# Patient Record
Sex: Male | Born: 1995 | Race: Black or African American | Hispanic: No | Marital: Single | Smoking: Current every day smoker
Health system: Southern US, Community
[De-identification: ages and names within clinical notes are randomized; demographics above are authoritative.]

## PROBLEM LIST (undated history)

## (undated) DIAGNOSIS — J45909 Unspecified asthma, uncomplicated: Secondary | ICD-10-CM

---
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
## 2015-07-31 ENCOUNTER — Emergency Department (HOSPITAL_COMMUNITY)
Admission: EM | Admit: 2015-07-31 | Discharge: 2015-07-31 | Disposition: A | Discharge status: Home / Self Care | Payer: Medicaid Other | Attending: Emergency Medicine | Admitting: Emergency Medicine

## 2015-07-31 ENCOUNTER — Encounter (HOSPITAL_COMMUNITY): Payer: Self-pay | Admitting: Emergency Medicine

## 2015-07-31 DIAGNOSIS — Y998 Other external cause status: Secondary | ICD-10-CM | POA: Diagnosis not present

## 2015-07-31 DIAGNOSIS — F17200 Nicotine dependence, unspecified, uncomplicated: Secondary | ICD-10-CM | POA: Diagnosis not present

## 2015-07-31 DIAGNOSIS — W268XXA Contact with other sharp object(s), not elsewhere classified, initial encounter: Secondary | ICD-10-CM | POA: Insufficient documentation

## 2015-07-31 DIAGNOSIS — S71112A Laceration without foreign body, left thigh, initial encounter: Secondary | ICD-10-CM | POA: Insufficient documentation

## 2015-07-31 DIAGNOSIS — Y9301 Activity, walking, marching and hiking: Secondary | ICD-10-CM | POA: Diagnosis not present

## 2015-07-31 DIAGNOSIS — Y92008 Other place in unspecified non-institutional (private) residence as the place of occurrence of the external cause: Secondary | ICD-10-CM | POA: Insufficient documentation

## 2015-07-31 DIAGNOSIS — T148 Other injury of unspecified body region: Secondary | ICD-10-CM | POA: Diagnosis not present

## 2015-07-31 DIAGNOSIS — Z23 Encounter for immunization: Secondary | ICD-10-CM | POA: Insufficient documentation

## 2015-07-31 DIAGNOSIS — IMO0002 Reserved for concepts with insufficient information to code with codable children: Secondary | ICD-10-CM

## 2015-07-31 MED ORDER — TETANUS-DIPHTH-ACELL PERTUSSIS 5-2.5-18.5 LF-MCG/0.5 IM SUSP
0.5000 mL | Freq: Once | INTRAMUSCULAR | Status: AC
  Administered 2015-07-31: 0.5 mL via INTRAMUSCULAR
  Filled 2015-07-31: qty 0.5

## 2015-07-31 MED ORDER — LIDOCAINE HCL (PF) 1 % IJ SOLN
5.0000 mL | Freq: Once | INTRAMUSCULAR | Status: AC
  Administered 2015-07-31: 5 mL
  Filled 2015-07-31: qty 5

## 2015-07-31 MED ORDER — OXYCODONE-ACETAMINOPHEN 5-325 MG PO TABS
1.0000 | ORAL_TABLET | Freq: Once | ORAL | Status: AC
  Administered 2015-07-31: 1 via ORAL
  Filled 2015-07-31: qty 1

## 2015-07-31 NOTE — ED Provider Notes (Signed)
CSN: [id number]     Arrival date & time 07/31/15  1820 History   By signing my name below, I, Arlan Organshley Leger, attest that this documentation has been prepared under the direction and in the presence of Earley FavorGail Manpreet Kemmer, FNP.  Electronically Signed: Arlan OrganAshley Leger, ED Scribe. 07/31/2015. 8:10 PM.   Chief Complaint  Patient presents with  . Extremity Laceration    left thigh    The history is provided by the patient. No language interpreter was used.     HPI Comments: Alfredo BachJustice R Alan is a 20 y.o. male without any pertinent past medical history who presents to the Emergency Department here for an extremity laceration to the L lateral thigh sustained at 4:30 PM this afternoon. Pt states he was walking across his front porch when part of a rusty hand rail scraping him across his leg. Bleeding controlled at this time. No cleansing attempted to wound prior to arrival. However, area is protected with a wound on arrival. Pt denies any fever or chills. No known allergies to medications.  PCP: No primary care provider on file.    History reviewed. No pertinent past medical history. History reviewed. No pertinent past surgical history. No family history on file. Social History  Substance Use Topics  . Smoking status: Current Every Day Smoker -- 0.50 packs/day  . Smokeless tobacco: None  . Alcohol Use: Yes     Comment: socially     Review of Systems  Skin: Positive for wound.  All other systems reviewed and are negative.     Allergies  Review of patient's allergies indicates no known allergies.  Home Medications   Prior to Admission medications   Not on File   Triage Vitals: BP 117/75 mmHg  Pulse 87  Temp(Src) 98.2 F (36.8 C) (Oral)  Resp 18  Ht 6\' 2"  (1.88 m)  Wt 67.586 kg  BMI 19.12 kg/m2  SpO2 100%   Physical Exam  Constitutional: He is oriented to person, place, and time. He appears well-developed and well-nourished.  HENT:  Head: Normocephalic.  Eyes: EOM are normal.   Neck: Normal range of motion.  Pulmonary/Chest: Effort normal.  Abdominal: He exhibits no distension.  Musculoskeletal: Normal range of motion.  Neurological: He is alert and oriented to person, place, and time.  Skin:  Superficial wide laceration measuring 6 cm in size to the L lateral thigh No erythema Mild bruising noted 2 areas of bruising approximately 1 cm on each side of wound about 1/3 of the way down  Psychiatric: He has a normal mood and affect.  Nursing note and vitals reviewed.   ED Course  Procedures (including critical care time)  DIAGNOSTIC STUDIES: Oxygen Saturation is 100% on RA, Normal by my interpretation.    COORDINATION OF CARE: 8:10 PM-Discussed treatment plan with pt at bedside and pt agreed to plan.     LACERATION REPAIR Performed by: Earley FavorGail Otelia Hettinger, FNP  Consent: Verbal consent obtained. Risks and benefits: risks, benefits and alternatives were discussed Patient identity confirmed: provided demographic data Time out performed prior to procedure Prepped and Draped in normal sterile fashion Wound explored Laceration Location: L lateral thigh  Laceration Length: 6 cm No Foreign Bodies seen or palpated Anesthesia: local infiltration Local anesthetic: lidocaine 1% without epinephrine Anesthetic total: 7 ml Irrigation method: syringe Amount of cleaning: standard Skin closure: 3-0 Prolene  Number of sutures or staples: 9 Technique: Simple Interrupted Patient tolerance: Patient tolerated the procedure well with no immediate complications.   Labs Review Labs  Reviewed - No data to display  Imaging Review No results found. I have personally reviewed and evaluated these images and lab results as part of my medical decision-making.   EKG Interpretation None     Tetanus updated  MDM   Final diagnoses:  Laceration    I personally performed the services described in this documentation, which was scribed in my presence. The recorded information has  been reviewed and is accurate.  Earley Favor, NP 07/31/15 9562  Tilden Fossa, MD 08/01/15 1452

## 2015-07-31 NOTE — Discharge Instructions (Signed)
Your sutures should be removed in 10 days you can go to Urgent care for this service

## 2015-07-31 NOTE — ED Notes (Addendum)
Pt from home with a laceration on his left outer thigh. Pt states he was walking across his front porch and part of a rusty hand rail scraped across his leg. The laceration is about 4 inches long and is deep. Bleeding is controlled at this time. Pt states his last tetanus shot was more than 10 years ago.

## 2016-08-13 ENCOUNTER — Emergency Department (HOSPITAL_COMMUNITY)
Admission: EM | Admit: 2016-08-13 | Discharge: 2016-08-13 | Disposition: A | Discharge status: Home / Self Care | Payer: Medicaid Other | Attending: Emergency Medicine | Admitting: Emergency Medicine

## 2016-08-13 ENCOUNTER — Encounter (HOSPITAL_COMMUNITY): Payer: Self-pay

## 2016-08-13 DIAGNOSIS — Y939 Activity, unspecified: Secondary | ICD-10-CM | POA: Diagnosis not present

## 2016-08-13 DIAGNOSIS — W19XXXA Unspecified fall, initial encounter: Secondary | ICD-10-CM | POA: Insufficient documentation

## 2016-08-13 DIAGNOSIS — M542 Cervicalgia: Secondary | ICD-10-CM | POA: Diagnosis not present

## 2016-08-13 DIAGNOSIS — S56912A Strain of unspecified muscles, fascia and tendons at forearm level, left arm, initial encounter: Secondary | ICD-10-CM | POA: Insufficient documentation

## 2016-08-13 DIAGNOSIS — Y929 Unspecified place or not applicable: Secondary | ICD-10-CM | POA: Insufficient documentation

## 2016-08-13 DIAGNOSIS — Y999 Unspecified external cause status: Secondary | ICD-10-CM | POA: Diagnosis not present

## 2016-08-13 DIAGNOSIS — F17200 Nicotine dependence, unspecified, uncomplicated: Secondary | ICD-10-CM | POA: Insufficient documentation

## 2016-08-13 DIAGNOSIS — T148XXA Other injury of unspecified body region, initial encounter: Secondary | ICD-10-CM

## 2016-08-13 DIAGNOSIS — S4992XA Unspecified injury of left shoulder and upper arm, initial encounter: Secondary | ICD-10-CM | POA: Diagnosis present

## 2016-08-13 MED ORDER — IBUPROFEN 800 MG PO TABS: 800.0000 mg | ORAL_TABLET | Freq: Three times a day (TID) | ORAL | 0 refills | Status: DC

## 2016-08-13 MED ORDER — CYCLOBENZAPRINE HCL 10 MG PO TABS: 10.0000 mg | ORAL_TABLET | Freq: Two times a day (BID) | ORAL | 0 refills | Status: DC | PRN

## 2016-08-13 NOTE — ED Provider Notes (Signed)
[facility]-EMERGENCY DEPT Provider Note   CSN: [id number] Arrival date & time: 08/13/16  0107     History   Chief Complaint Chief Complaint  Patient presents with  . Fall  . Arm Pain    HPI Danny White is a 21 y.o. male.  Patient presents emergency department with chief complaint of left arm pain. He states that he got drunk and passed out yesterday. He complains of some pain on left side of his neck and upper back that radiates into his left forearm. He denies any numbness, weakness, but states that he does have some tingling sensation. He denies any other associated symptoms. He states that he thinks he hit his head when he passed out, but he denies any nausea, vomiting, vision changes, or speech changes now. He denies any headache. He has not taken anything for his symptoms. He denies any chest pain or shortness breath.   The history is provided by the patient. No language interpreter was used.    History reviewed. No pertinent past medical history.  There are no active problems to display for this patient.   History reviewed. No pertinent surgical history.     Home Medications    Prior to Admission medications   Medication Sig Start Date End Date Taking? Authorizing Provider  cyclobenzaprine (FLEXERIL) 10 MG tablet Take 1 tablet (10 mg total) by mouth 2 (two) times daily as needed for muscle spasms. 08/13/16   Roxy Horseman, PA-C  ibuprofen (ADVIL,MOTRIN) 800 MG tablet Take 1 tablet (800 mg total) by mouth 3 (three) times daily. 08/13/16   Roxy Horseman, PA-C    Family History History reviewed. No pertinent family history.  Social History Social History  Substance Use Topics  . Smoking status: Current Every Day Smoker    Packs/day: 0.50  . Smokeless tobacco: Never Used  . Alcohol use Yes     Comment: socially      Allergies   Patient has no known allergies.   Review of Systems Review of Systems  All other systems reviewed and are  negative.    Physical Exam Updated Vital Signs BP 109/74   Pulse 62   Temp 98 F (36.7 C) (Oral)   Resp 16   Ht  (0.762 m)   Wt 69.9 kg   SpO2 100%   BMI 120.30 kg/m   Physical Exam  Constitutional: He is oriented to person, place, and time. He appears well-developed and well-nourished.  HENT:  Head: Normocephalic and atraumatic.  No evidence of traumatic head injury  Eyes: Conjunctivae and EOM are normal. Pupils are equal, round, and reactive to light. Right eye exhibits no discharge. Left eye exhibits no discharge. No scleral icterus.  Neck: Normal range of motion. Neck supple. No JVD present.  Cardiovascular: Normal rate, regular rhythm and normal heart sounds.  Exam reveals no gallop and no friction rub.   No murmur heard. Pulmonary/Chest: Effort normal and breath sounds normal. No respiratory distress. He has no wheezes. He has no rales. He exhibits no tenderness.  Abdominal: Soft. He exhibits no distension and no mass. There is no tenderness. There is no rebound and no guarding.  Musculoskeletal: Normal range of motion. He exhibits no edema or tenderness.  Range of motion and strength bilateral upper extremities 5/5, patient does have some mild tenderness to the left upper trapezius and cervical paraspinal muscles  Neurological: He is alert and oriented to person, place, and time.  Skin: Skin is warm and dry.  Psychiatric:  He has a normal mood and affect. His behavior is normal. Judgment and thought content normal.  Nursing note and vitals reviewed.    ED Treatments / Results  Labs (all labs ordered are listed, but only abnormal results are displayed) Labs Reviewed - No data to display  EKG  EKG Interpretation None       Radiology No results found.  Procedures Procedures (including critical care time)  Medications Ordered in ED Medications - No data to display   Initial Impression / Assessment and Plan / ED Course  I have reviewed the triage  vital signs and the nursing notes.  Pertinent labs & imaging results that were available during my care of the patient were reviewed by me and considered in my medical decision making (see chart for details).     Patient was drinking and using marijuana yesterday, passed out, and believes that he hit his head. He has had no vomiting. He has no headache. This occurred yesterday. His neurovascular intact. I do not believe that he needs CT imaging of his head given the length of time from injury. Patient does have some muscle soreness in his neck and left upper back. I think this may be causing a mild radiculopathy into his left arm. He has no weakness. Will treat with ibuprofen and Flexeril. Recommend sports medicine follow-up. Patient is well-appearing. He understands and agrees the plan. He is stable and ready for discharge.  Final Clinical Impressions(s) / ED Diagnoses   Final diagnoses:  Muscle strain    New Prescriptions New Prescriptions   CYCLOBENZAPRINE (FLEXERIL) 10 MG TABLET    Take 1 tablet (10 mg total) by mouth 2 (two) times daily as needed for muscle spasms.   IBUPROFEN (ADVIL,MOTRIN) 800 MG TABLET    Take 1 tablet (800 mg total) by mouth 3 (three) times daily.     Roxy Horseman, PA-C 08/13/16 1324    Devoria Albe, MD 08/13/16 [phone]

## 2016-08-13 NOTE — ED Triage Notes (Signed)
Pt complaining of syncopal episode yesterday. Pt states in heat, wearing jacket. Pt denies any head injury/trauma. Pt complaining of L shoulder and neck pain. Pt a/o x 4, ambulatory, NAD. Pt denies any chest pain or SOB. Pt states ETOH and marijuana use yesterday.

## 2016-08-13 NOTE — ED Notes (Signed)
ED Provider at bedside. 

## 2017-05-06 ENCOUNTER — Encounter (HOSPITAL_COMMUNITY): Payer: Self-pay | Admitting: *Deleted

## 2017-05-06 ENCOUNTER — Emergency Department (HOSPITAL_COMMUNITY)
Admission: EM | Admit: 2017-05-06 | Discharge: 2017-05-06 | Disposition: A | Discharge status: Home / Self Care | Payer: Self-pay | Attending: Emergency Medicine | Admitting: Emergency Medicine

## 2017-05-06 ENCOUNTER — Other Ambulatory Visit: Payer: Self-pay

## 2017-05-06 DIAGNOSIS — F17200 Nicotine dependence, unspecified, uncomplicated: Secondary | ICD-10-CM | POA: Insufficient documentation

## 2017-05-06 DIAGNOSIS — L0201 Cutaneous abscess of face: Secondary | ICD-10-CM | POA: Insufficient documentation

## 2017-05-06 MED ORDER — LIDOCAINE HCL (PF) 1 % IJ SOLN
5.0000 mL | Freq: Once | INTRAMUSCULAR | Status: AC
  Administered 2017-05-06: 5 mL
  Filled 2017-05-06: qty 5

## 2017-05-06 MED ORDER — CEPHALEXIN 500 MG PO CAPS: 500.0000 mg | ORAL_CAPSULE | Freq: Four times a day (QID) | ORAL | 0 refills | Status: AC

## 2017-05-06 NOTE — Discharge Instructions (Signed)
Please read attached information regarding your condition. Take Keflex as directed. Return to ED for worsening pain or drainage, increased swelling, fevers, injury to area, trouble breathing or trouble swallowing.

## 2017-05-06 NOTE — ED Triage Notes (Signed)
The pt is c/o jaw pain for 3 days after he felt a pimple in his chin

## 2017-05-06 NOTE — ED Provider Notes (Signed)
[facility]   CSN: [id number] Arrival date & time: 05/06/17  2038     History   Chief Complaint Chief Complaint  Patient presents with  . Jaw Pain    HPI Danny White is a 21 y.o. male with no significant past medical history, who presents to ED for evaluation of chin pain for the past 3 days.  He states that he feels like his chin is swollen.  Symptoms began suddenly and what he thought was initially a pimple has just gotten larger in size.  He has taken ibuprofen with no relief in his symptoms.  He denies any injury to area, trouble breathing, trouble swallowing, neck pain, fevers or drainage from area, dental pain or abscess.  No previous history of similar symptoms in the past.  HPI  History reviewed. No pertinent past medical history.  There are no active problems to display for this patient.   History reviewed. No pertinent surgical history.     Home Medications    Prior to Admission medications   Medication Sig Start Date End Date Taking? Authorizing Provider  ibuprofen (ADVIL,MOTRIN) 200 MG tablet Take 400 mg by mouth every 6 (six) hours as needed for mild pain.   Yes [provider]  cephALEXin (KEFLEX) 500 MG capsule Take 1 capsule (500 mg total) by mouth 4 (four) times daily. 05/06/17   Dietrich PatesKhatri, Viren Lebeau, PA-C    Family History No family history on file.  Social History Social History   Tobacco Use  . Smoking status: Current Every Day Smoker    Packs/day: 0.50  . Smokeless tobacco: Never Used  Substance Use Topics  . Alcohol use: Yes    Comment: socially   . Drug use: Yes    Types: Marijuana     Allergies   Patient has no known allergies.   Review of Systems Review of Systems  Constitutional: Negative for chills and fever.  HENT: Positive for facial swelling. Negative for dental problem, rhinorrhea, sinus pain, sore throat, trouble swallowing and voice change.   Gastrointestinal:  Negative for nausea and vomiting.  Musculoskeletal: Negative for back pain, myalgias, neck pain and neck stiffness.  Skin: Positive for color change.     Physical Exam Updated Vital Signs BP 109/66 (BP Location: Right Arm) Comment: Simultaneous filing. User may not have seen previous data.  Pulse 81   Temp 98.5 F (36.9 C) (Oral)   Resp 18   Ht 6\' 2"  (1.88 m)   Wt 67.6 kg (149 lb)   SpO2 99%   BMI 19.13 kg/m   Physical Exam  Constitutional: He appears well-developed and well-nourished. No distress.  Nontoxic appearing and in no acute distress.  Speaking in complete sentences with no signs of airway compromise.  HENT:  Head: Normocephalic and atraumatic.  Mouth/Throat: No dental abscesses. No posterior oropharyngeal edema or posterior oropharyngeal erythema. No tonsillar exudate.  No facial, neck or cheek swelling noted. No pooling of secretions or trismus.  Normal voice noted with no difficulty swallowing or breathing. Floor of mouth is soft. No crepitus noted.  Eyes: Conjunctivae and EOM are normal. No scleral icterus.  Neck: Normal range of motion.  Pulmonary/Chest: Effort normal. No respiratory distress.  Neurological: He is alert.  Skin: No rash noted. He is not diaphoretic. No erythema.  Tenderness to palpation of the chin with some mild induration noted.  No site of drainage, fluctuance noted.  Psychiatric: He has a normal mood and affect.  Nursing note and vitals reviewed.    ED Treatments / Results  Labs (all labs ordered are listed, but only abnormal results are displayed) Labs Reviewed - No data to display  EKG  EKG Interpretation None       Radiology No results found.  Procedures .Marland Kitchen.Incision and Drainage Date/Time: 05/06/2017 11:28 PM Performed by: Dietrich PatesKhatri, Staria Birkhead, PA-C Authorized by: Dietrich PatesKhatri, Ameya Kutz, PA-C   Consent:    Consent obtained:  Verbal   Consent given by:  Patient   Risks discussed:  Bleeding, incomplete drainage and pain Location:    Type:   Abscess   Location:  Head   Head location:  Face Pre-procedure details:    Skin preparation:  Betadine Procedure type:    Complexity:  Simple Procedure details:    Needle aspiration: no     Incision types:  Stab incision   Scalpel blade:  11   Wound management:  Probed and deloculated and irrigated with saline   Drainage:  Bloody and purulent   Drainage amount:  Scant   Wound treatment:  Wound left open Post-procedure details:    Patient tolerance of procedure:  Tolerated well, no immediate complications    (including critical care time)  EMERGENCY DEPARTMENT US SOFT TISSUE INTERPRETATION "Study: Limited Soft Tissue Ultrasound"  INDICATIONS: Soft tissue infection Multiple views of the body part were obtained in real-time with a multi-frequency linear probe  PERFORMED BY: Myself IMAGES ARCHIVED?: No SIDE:Midline BODY PART:face, chin INTERPRETATION:  Abcess present    Medications Ordered in ED Medications  lidocaine (PF) (XYLOCAINE) 1 % injection 5 mL (5 mLs Infiltration Given 05/06/17 2255)     Initial Impression / Assessment and Plan / ED Course  I have reviewed the triage vital signs and the nursing notes.  Pertinent labs & imaging results that were available during my care of the patient were reviewed by me and considered in my medical decision making (see chart for details).     Patient presents to ED for evaluation of possible abscess to chin which he noticed 3 days ago.  No dental pain or dental abscess noted.  No previous history of similar symptoms.  He is afebrile here.  No neck swelling, sore throat or signs of Ludwig's angina or other acute deep tissue infection of the neck.  Tolerating secretions with no signs of airway compromise.  Ultrasound showed abscess in the indicated area.  Incision and drainage was performed.  Will place patient on antibiotics.  Advised to follow-up at [facility] for further evaluation.  Patient appears stable for discharge at  this time.  Strict return precautions given.  Final Clinical Impressions(s) / ED Diagnoses   Final diagnoses:  Facial abscess    ED Discharge Orders        Ordered    cephALEXin (KEFLEX) 500 MG capsule  4 times daily     05/06/17 2329     Portions of this note were generated with Dragon dictation software. Dictation errors may occur despite best attempts at proofreading.    Dietrich PatesKhatri, Talyia Allende, PA-C 05/06/17 2333    Gerhard MunchLockwood, Robert, MD 05/06/17 [phone]

## 2017-05-30 ENCOUNTER — Encounter (HOSPITAL_COMMUNITY): Payer: Self-pay | Admitting: Emergency Medicine

## 2017-05-30 DIAGNOSIS — F17200 Nicotine dependence, unspecified, uncomplicated: Secondary | ICD-10-CM | POA: Insufficient documentation

## 2017-05-30 DIAGNOSIS — R1011 Right upper quadrant pain: Secondary | ICD-10-CM | POA: Insufficient documentation

## 2017-05-30 DIAGNOSIS — J45909 Unspecified asthma, uncomplicated: Secondary | ICD-10-CM | POA: Insufficient documentation

## 2017-05-30 LAB — CBC
HEMATOCRIT: 39.5 % (ref 39.0–52.0)
HEMOGLOBIN: 13.5 g/dL (ref 13.0–17.0)
MCH: 30.6 pg (ref 26.0–34.0)
MCHC: 34.2 g/dL (ref 30.0–36.0)
MCV: 89.6 fL (ref 78.0–100.0)
PLATELETS: 193 10*3/uL (ref 150–400)
RBC: 4.41 MIL/uL (ref 4.22–5.81)
RDW: 12.7 % (ref 11.5–15.5)
WBC: 7.8 10*3/uL (ref 4.0–10.5)

## 2017-05-30 LAB — URINALYSIS, ROUTINE W REFLEX MICROSCOPIC
Bilirubin Urine: NEGATIVE
GLUCOSE, UA: NEGATIVE mg/dL
Hgb urine dipstick: NEGATIVE
Ketones, ur: NEGATIVE mg/dL
LEUKOCYTES UA: NEGATIVE
Nitrite: NEGATIVE
PH: 6 (ref 5.0–8.0)
PROTEIN: NEGATIVE mg/dL
SPECIFIC GRAVITY, URINE: 1.025 (ref 1.005–1.030)

## 2017-05-30 LAB — COMPREHENSIVE METABOLIC PANEL
ALT: 14 U/L — ABNORMAL LOW (ref 17–63)
AST: 22 U/L (ref 15–41)
Albumin: 4.5 g/dL (ref 3.5–5.0)
Alkaline Phosphatase: 63 U/L (ref 38–126)
Anion gap: 10 (ref 5–15)
BUN: 9 mg/dL (ref 6–20)
CHLORIDE: 105 mmol/L (ref 101–111)
CO2: 25 mmol/L (ref 22–32)
Calcium: 9.4 mg/dL (ref 8.9–10.3)
Creatinine, Ser: 1.21 mg/dL (ref 0.61–1.24)
Glucose, Bld: 95 mg/dL (ref 65–99)
Potassium: 3.6 mmol/L (ref 3.5–5.1)
Sodium: 140 mmol/L (ref 135–145)
Total Bilirubin: 0.9 mg/dL (ref 0.3–1.2)
Total Protein: 7.2 g/dL (ref 6.5–8.1)

## 2017-05-30 LAB — LIPASE, BLOOD: LIPASE: 27 U/L (ref 11–51)

## 2017-05-30 NOTE — ED Triage Notes (Signed)
Pt reports RUQ abd pain onset last night, states it woke him up from sleep. Also had 1 episode of N/V. Pt states pain is intermittent, sharp, hurts with movement. Currently pain free.

## 2017-05-31 ENCOUNTER — Emergency Department (HOSPITAL_COMMUNITY): Payer: Medicaid Other

## 2017-05-31 ENCOUNTER — Emergency Department (HOSPITAL_COMMUNITY)
Admission: EM | Admit: 2017-05-31 | Discharge: 2017-05-31 | Disposition: A | Discharge status: Home / Self Care | Payer: Medicaid Other | Attending: Emergency Medicine | Admitting: Emergency Medicine

## 2017-05-31 DIAGNOSIS — R1011 Right upper quadrant pain: Secondary | ICD-10-CM

## 2017-05-31 HISTORY — DX: Unspecified asthma, uncomplicated: J45.909

## 2017-05-31 MED ORDER — CYCLOBENZAPRINE HCL 10 MG PO TABS: 10.0000 mg | ORAL_TABLET | Freq: Once | ORAL | Status: DC

## 2017-05-31 MED ORDER — CYCLOBENZAPRINE HCL 10 MG PO TABS: 10.0000 mg | ORAL_TABLET | Freq: Two times a day (BID) | ORAL | 0 refills | Status: AC | PRN

## 2017-05-31 NOTE — Discharge Instructions (Signed)
Return if you have persistent vomiting, worsening pain, or fever.

## 2017-05-31 NOTE — ED Provider Notes (Signed)
[facility] EMERGENCY DEPARTMENT Provider Note   CSN: [id number] Arrival date & time: 05/30/17  2023     History   Chief Complaint Chief Complaint  Patient presents with  . Abdominal Pain    HPI Danny White is a 22 y.o. male.  Patient presents to the ED with a chief complaint of right rib pain.  He states that the symptoms started this morning.  He reports that he may have slept on it wrong.  He reports that he has had a cough for the past week.  He also reports that he had some nausea and vomiting. He states that he has not taken anything for his symptoms.  He denies any other associate symptoms.   The history is provided by the patient. No language interpreter was used.    Past Medical History:  Diagnosis Date  . Asthma     There are no active problems to display for this patient.   History reviewed. No pertinent surgical history.     Home Medications    Prior to Admission medications   Medication Sig Start Date End Date Taking? Authorizing Provider  cephALEXin (KEFLEX) 500 MG capsule Take 1 capsule (500 mg total) by mouth 4 (four) times daily. 05/06/17   Khatri, Hina, PA-C  ibuprofen (ADVIL,MOTRIN) 200 MG tablet Take 400 mg by mouth every 6 (six) hours as needed for mild pain.    [provider]    Family History No family history on file.  Social History Social History   Tobacco Use  . Smoking status: Current Every Day Smoker    Packs/day: 0.50  . Smokeless tobacco: Never Used  Substance Use Topics  . Alcohol use: Yes    Comment: socially   . Drug use: Yes    Types: Marijuana     Allergies   Patient has no known allergies.   Review of Systems Review of Systems  All other systems reviewed and are negative.    Physical Exam Updated Vital Signs BP 124/76 (BP Location: Right Arm)   Pulse 60   Temp 98.6 F (37 C) (Oral)   Resp 18   Ht 6\' 2"  (1.88 m)   Wt 68 kg (150 lb)   SpO2 100%   BMI 19.26 kg/m    Physical Exam  Constitutional: He is oriented to person, place, and time. He appears well-developed and well-nourished.  HENT:  Head: Normocephalic and atraumatic.  Eyes: Conjunctivae and EOM are normal. Pupils are equal, round, and reactive to light. Right eye exhibits no discharge. Left eye exhibits no discharge. No scleral icterus.  Neck: Normal range of motion. Neck supple. No JVD present.  Cardiovascular: Normal rate, regular rhythm and normal heart sounds. Exam reveals no gallop and no friction rub.  No murmur heard. Pulmonary/Chest: Effort normal and breath sounds normal. No respiratory distress. He has no wheezes. He has no rales. He exhibits no tenderness.  Abdominal: Soft. He exhibits no distension and no mass. There is no tenderness. There is no rebound and no guarding.  No focal abdominal tenderness, no RLQ tenderness or pain at McBurney's point, no RUQ tenderness or Murphy's sign, no left-sided abdominal tenderness, no fluid wave, or signs of peritonitis   Musculoskeletal: Normal range of motion. He exhibits no edema or tenderness.  Neurological: He is alert and oriented to person, place, and time.  Skin: Skin is warm and dry.  Psychiatric: He has a normal mood and affect. His behavior is normal. Judgment and  thought content normal.  Nursing note and vitals reviewed.    ED Treatments / Results  Labs (all labs ordered are listed, but only abnormal results are displayed) Labs Reviewed  COMPREHENSIVE METABOLIC PANEL - Abnormal; Notable for the following components:      Result Value   ALT 14 (*)    All other components within normal limits  LIPASE, BLOOD  CBC  URINALYSIS, ROUTINE W REFLEX MICROSCOPIC    EKG  EKG Interpretation None       Radiology No results found.  Procedures Procedures (including critical care time)  Medications Ordered in ED Medications - No data to display   Initial Impression / Assessment and Plan / ED Course  I have reviewed the  triage vital signs and the nursing notes.  Pertinent labs & imaging results that were available during my care of the patient were reviewed by me and considered in my medical decision making (see chart for details).     Patient with right rib pain.  Has had cough x 1 week.  Worse with movement and palpation.  No SOB.  Has had some vomiting, but no focal abdominal tenderness.   Will check CXR.    CXR negative.  No blood in urine, doubt KS.  Doubt gallbladder, no focal tenderness, unremarkable LFTs.  VSS  Final Clinical Impressions(s) / ED Diagnoses   Final diagnoses:  Right upper quadrant abdominal pain    ED Discharge Orders        Ordered    cyclobenzaprine (FLEXERIL) 10 MG tablet  2 times daily PRN     05/31/17 0105       Roxy HorsemanBrowning, Rhian Funari, PA-C 05/31/17 0107    Geoffery Lyonselo, Douglas, MD 05/31/17 [phone]

## 2017-05-31 NOTE — ED Notes (Signed)
Patient transported to X-ray 

## 2019-06-18 ENCOUNTER — Other Ambulatory Visit: Payer: Self-pay

## 2019-06-18 ENCOUNTER — Encounter: Payer: Self-pay | Admitting: Registered Nurse

## 2019-06-18 ENCOUNTER — Ambulatory Visit (INDEPENDENT_AMBULATORY_CARE_PROVIDER_SITE_OTHER): Payer: Self-pay | Admitting: Registered Nurse

## 2019-06-18 VITALS — BP 125/73 | HR 74 | Temp 98.0°F | Ht 74.0 in | Wt 175.6 lb

## 2019-06-18 DIAGNOSIS — Z1329 Encounter for screening for other suspected endocrine disorder: Secondary | ICD-10-CM

## 2019-06-18 DIAGNOSIS — Z13220 Encounter for screening for lipoid disorders: Secondary | ICD-10-CM

## 2019-06-18 DIAGNOSIS — Z13228 Encounter for screening for other metabolic disorders: Secondary | ICD-10-CM

## 2019-06-18 DIAGNOSIS — R21 Rash and other nonspecific skin eruption: Secondary | ICD-10-CM

## 2019-06-18 DIAGNOSIS — Z7689 Persons encountering health services in other specified circumstances: Secondary | ICD-10-CM

## 2019-06-18 DIAGNOSIS — Z130 Encounter for screening for diseases of the blood and blood-forming organs and certain disorders involving the immune mechanism: Secondary | ICD-10-CM

## 2019-06-18 MED ORDER — VALACYCLOVIR HCL 1 G PO TABS: 1000.0000 mg | ORAL_TABLET | Freq: Three times a day (TID) | ORAL | 0 refills | Status: AC

## 2019-06-18 NOTE — Progress Notes (Signed)
New Patient Office Visit  Subjective:  Patient ID: Danny White    DOB: 06/22/1995  Age: 24 y.o. MRN: [id number]  CC:  Chief Complaint  Patient presents with  . New Patient (Initial Visit)    establish care and patient has a bump on genital for about a month. per patient it does not hurt and its some more small one forming.[id number]=4    HPI Danny White presents for visit to establish care and rash  Only medical history is asthma. Some CV history in family as noted below. Daily marijuana smoker, smokes 1/2 ppd cigarettes. Endorses good diet and exercise habits  Notes that rash on his penis started 2-3 weeks ago. Seems to be improving. Notes that he had one spot on the anterior aspect of the glans, now 2 spots on the left lateral side of glans. All seem to be flaking and improving. Previously looked vesicular, but no drainage or discharge. Notes that he has a partner who has told him she has HSV.   Past Medical History:  Diagnosis Date  . Asthma     History reviewed. No pertinent surgical history.  Family History  Problem Relation Age of Onset  . Stroke Mother   . Healthy Mother   . Hyperlipidemia Sister   . Heart disease Maternal Grandmother   . Heart disease Maternal Grandfather   . Healthy Paternal Grandmother   . Healthy Paternal Grandfather     Social History   Socioeconomic History  . Marital status: Significant Other    Spouse name: Not on file  . Number of children: Not on file  . Years of education: Not on file  . Highest education level: Not on file  Occupational History  . Not on file  Tobacco Use  . Smoking status: Current Every Day Smoker    Packs/day: 0.50  . Smokeless tobacco: Never Used  Substance and Sexual Activity  . Alcohol use: Yes    Comment: socially   . Drug use: Yes    Types: Marijuana  . Sexual activity: Yes  Other Topics Concern  . Not on file  Social History Narrative  . Not on file   Social Determinants of Health    Financial Resource Strain:   . Difficulty of Paying Living Expenses: Not on file  Food Insecurity:   . Worried About Programme researcher, broadcasting/film/video in the Last Year: Not on file  . Ran Out of Food in the Last Year: Not on file  Transportation Needs:   . Lack of Transportation (Medical): Not on file  . Lack of Transportation (Non-Medical): Not on file  Physical Activity:   . Days of Exercise per Week: Not on file  . Minutes of Exercise per Session: Not on file  Stress:   . Feeling of Stress : Not on file  Social Connections:   . Frequency of Communication with Friends and Family: Not on file  . Frequency of Social Gatherings with Friends and Family: Not on file  . Attends Religious Services: Not on file  . Active Member of Clubs or Organizations: Not on file  . Attends Banker Meetings: Not on file  . Marital Status: Not on file  Intimate Partner Violence:   . Fear of Current or Ex-Partner: Not on file  . Emotionally Abused: Not on file  . Physically Abused: Not on file  . Sexually Abused: Not on file    ROS Review of Systems  Constitutional: Negative.  HENT: Negative.   Eyes: Negative.   Respiratory: Negative.   Cardiovascular: Negative.   Gastrointestinal: Negative.   Endocrine: Negative.   Genitourinary: Positive for genital sores. Negative for decreased urine volume, difficulty urinating, discharge, dysuria, enuresis, flank pain, frequency, hematuria, penile pain, penile swelling, scrotal swelling, testicular pain and urgency.  Musculoskeletal: Negative.   Skin: Negative.   Allergic/Immunologic: Negative.   Neurological: Negative.   Hematological: Negative.   Psychiatric/Behavioral: Negative.   All other systems reviewed and are negative.   Objective:   Today's Vitals: BP 125/73   Pulse 74   Temp 98 F (36.7 C) (Temporal)   Ht 6\' 2"  (1.88 m)   Wt 175 lb 9.6 oz (79.7 kg)   SpO2 96%   BMI 22.55 kg/m   Physical Exam Vitals and nursing note reviewed.   Constitutional:      General: He is not in acute distress.    Appearance: Normal appearance. He is normal weight. He is not ill-appearing, toxic-appearing or diaphoretic.  Cardiovascular:     Rate and Rhythm: Normal rate and regular rhythm.  Pulmonary:     Effort: Pulmonary effort is normal. No respiratory distress.  Genitourinary:    Penis: Circumcised. Lesions present.      Testes: Normal.    Musculoskeletal:     Cervical back: Normal range of motion and neck supple.  Skin:    General: Skin is warm and dry.     Coloration: Skin is not jaundiced or pale.     Findings: No bruising, erythema, lesion or rash.  Neurological:     General: No focal deficit present.     Mental Status: He is alert. Mental status is at baseline.  Psychiatric:        Mood and Affect: Mood normal.        Behavior: Behavior normal.        Thought Content: Thought content normal.        Judgment: Judgment normal.     Assessment & Plan:   Problem List Items Addressed This Visit    None    Visit Diagnoses    Rash of penis    -  Primary   Relevant Medications   valACYclovir (VALTREX) 1000 MG tablet   Other Relevant Orders   Herpes simplex virus culture   Screening for endocrine, metabolic and immunity disorder       Relevant Orders   CBC   Basic Metabolic Panel   TSH   Lipid screening       Relevant Orders   Lipid Panel      Outpatient Encounter Medications as of 06/18/2019  Medication Sig  . cephALEXin (KEFLEX) 500 MG capsule Take 1 capsule (500 mg total) by mouth 4 (four) times daily. (Patient not taking: Reported on 06/18/2019)  . cyclobenzaprine (FLEXERIL) 10 MG tablet Take 1 tablet (10 mg total) by mouth 2 (two) times daily as needed for muscle spasms. (Patient not taking: Reported on 06/18/2019)  . ibuprofen (ADVIL,MOTRIN) 200 MG tablet Take 400 mg by mouth every 6 (six) hours as needed for mild pain.  . valACYclovir (VALTREX) 1000 MG tablet Take 1 tablet (1,000 mg total) by mouth 3 (three)  times daily.   No facility-administered encounter medications on file as of 06/18/2019.    Follow-up: No follow-ups on file.   PLAN  Appears as HSV. Will culture and discuss results.   Otherwise, patient appears as healthy young man. Very pleasant throughout visit  Labs drawn, will follow up as  warranted  Suggest CPE to further discuss preventative care.  Patient encouraged to call clinic with any questions, comments, or concerns.  Maximiano Coss, NP

## 2019-06-19 LAB — CBC
Hematocrit: 44.2 % (ref 37.5–51.0)
Hemoglobin: 15.2 g/dL (ref 13.0–17.7)
MCH: 31.4 pg (ref 26.6–33.0)
MCHC: 34.4 g/dL (ref 31.5–35.7)
MCV: 91 fL (ref 79–97)
Platelets: 210 10*3/uL (ref 150–450)
RBC: 4.84 x10E6/uL (ref 4.14–5.80)
RDW: 12.5 % (ref 11.6–15.4)
WBC: 7.8 10*3/uL (ref 3.4–10.8)

## 2019-06-19 LAB — BASIC METABOLIC PANEL
BUN/Creatinine Ratio: 12 (ref 9–20)
BUN: 14 mg/dL (ref 6–20)
CO2: 22 mmol/L (ref 20–29)
Calcium: 9.7 mg/dL (ref 8.7–10.2)
Chloride: 105 mmol/L (ref 96–106)
Creatinine, Ser: 1.19 mg/dL (ref 0.76–1.27)
GFR calc Af Amer: 99 mL/min/{1.73_m2} (ref >59)
GFR calc non Af Amer: 86 mL/min/{1.73_m2} (ref >59)
Glucose: 98 mg/dL (ref 65–99)
Potassium: 4 mmol/L (ref 3.5–5.2)
Sodium: 141 mmol/L (ref 134–144)

## 2019-06-19 LAB — LIPID PANEL
Chol/HDL Ratio: 3.3 ratio (ref 0.0–5.0)
Cholesterol, Total: 181 mg/dL (ref 100–199)
HDL: 55 mg/dL (ref >39)
LDL Chol Calc (NIH): 105 mg/dL — ABNORMAL HIGH (ref 0–99)
Triglycerides: 120 mg/dL (ref 0–149)
VLDL Cholesterol Cal: 21 mg/dL (ref 5–40)

## 2019-06-19 LAB — TSH: TSH: 0.953 u[IU]/mL (ref 0.450–4.500)

## 2019-06-20 LAB — HERPES SIMPLEX VIRUS CULTURE

## 2019-06-23 NOTE — Progress Notes (Signed)
Good evening,  If we could sent a normal results letter to Danny White, that would be great.  Thank you  Jari Sportsman, NP

## 2019-06-24 ENCOUNTER — Encounter: Payer: Self-pay | Admitting: Radiology

## 2019-12-27 IMAGING — DX DG CHEST 2V
2 series · 2 of 2 positions shown · non-contrast
Comparison: 10/23/2006 chest radiograph.

CLINICAL DATA: Cough

EXAM:
CHEST  2 VIEW

[chest pa]
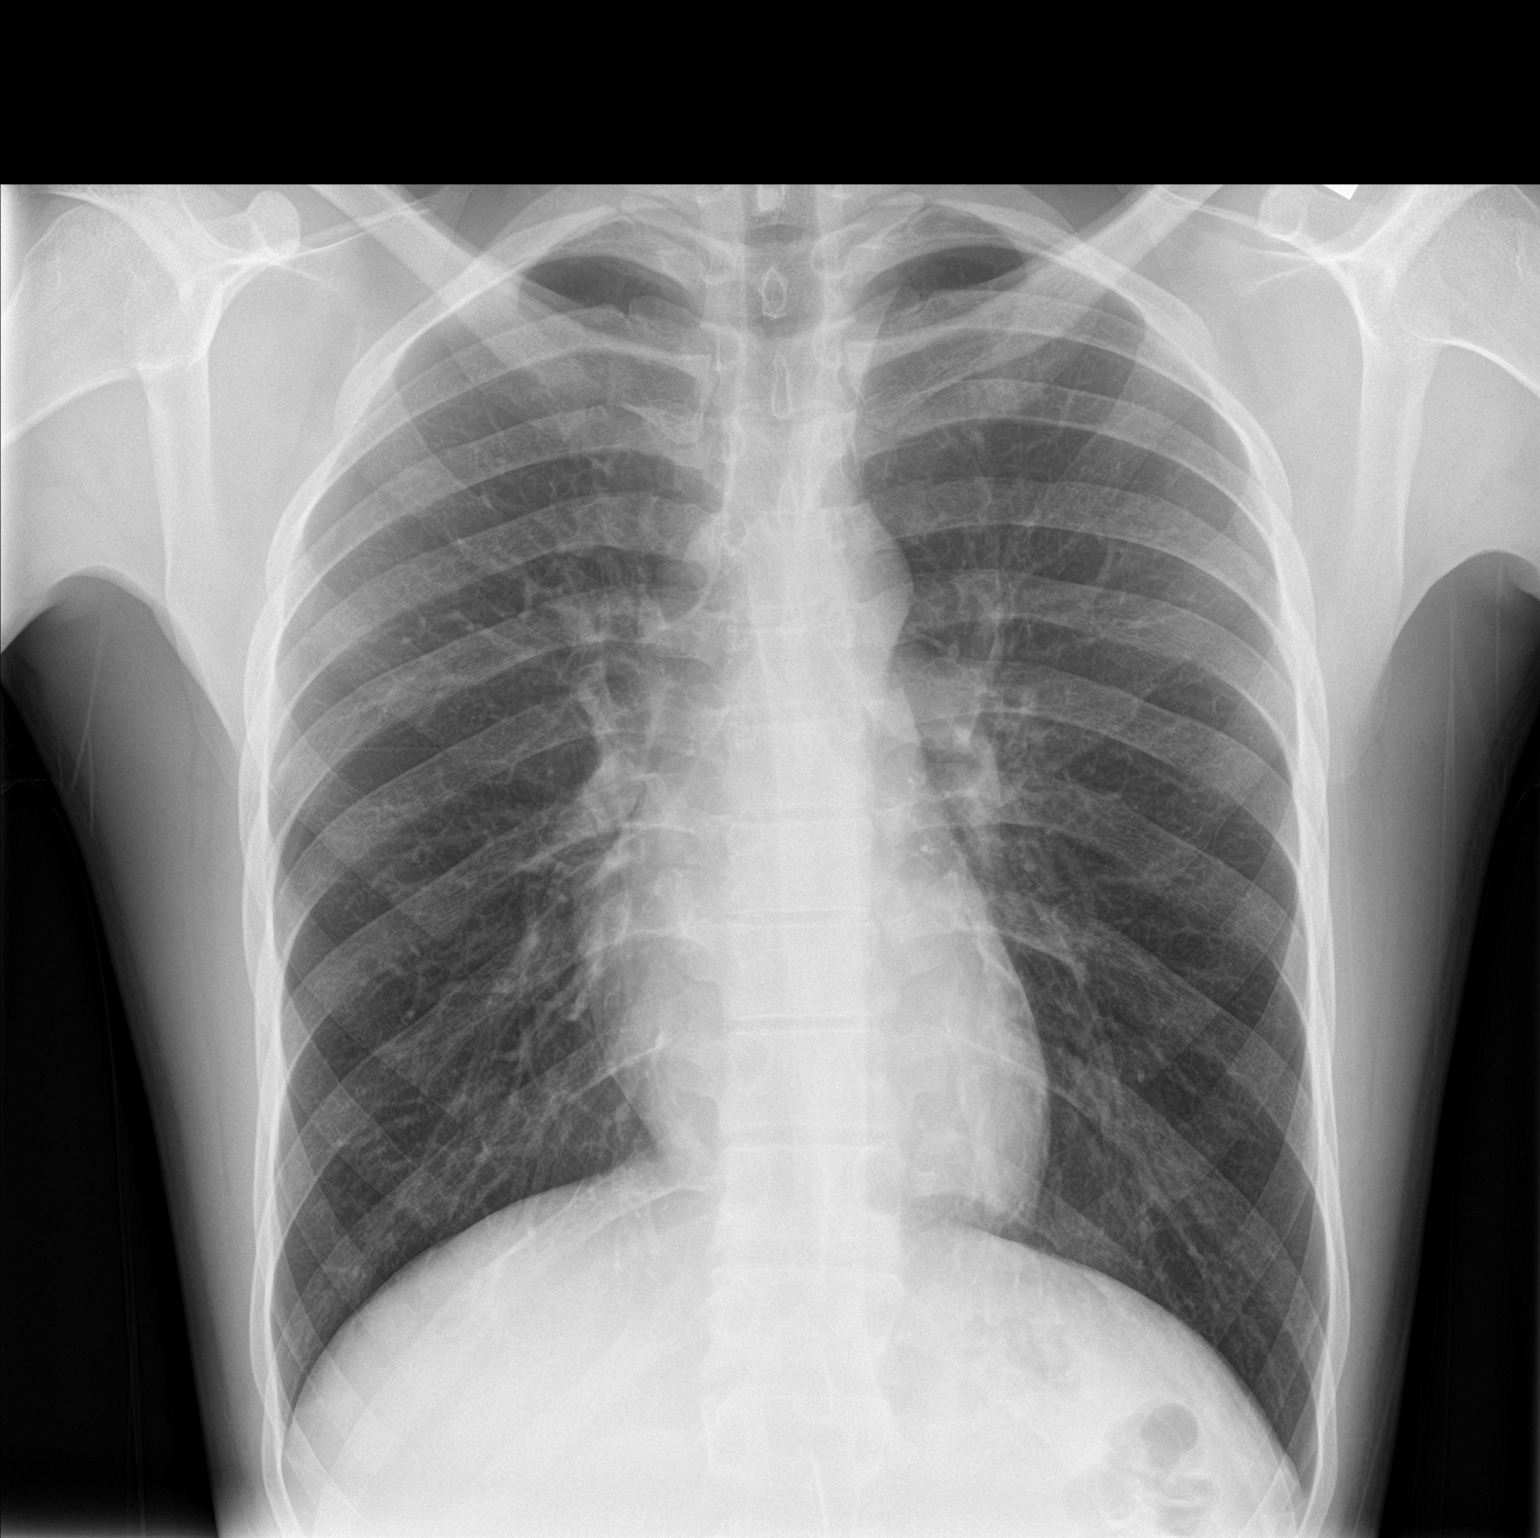

[chest lat]
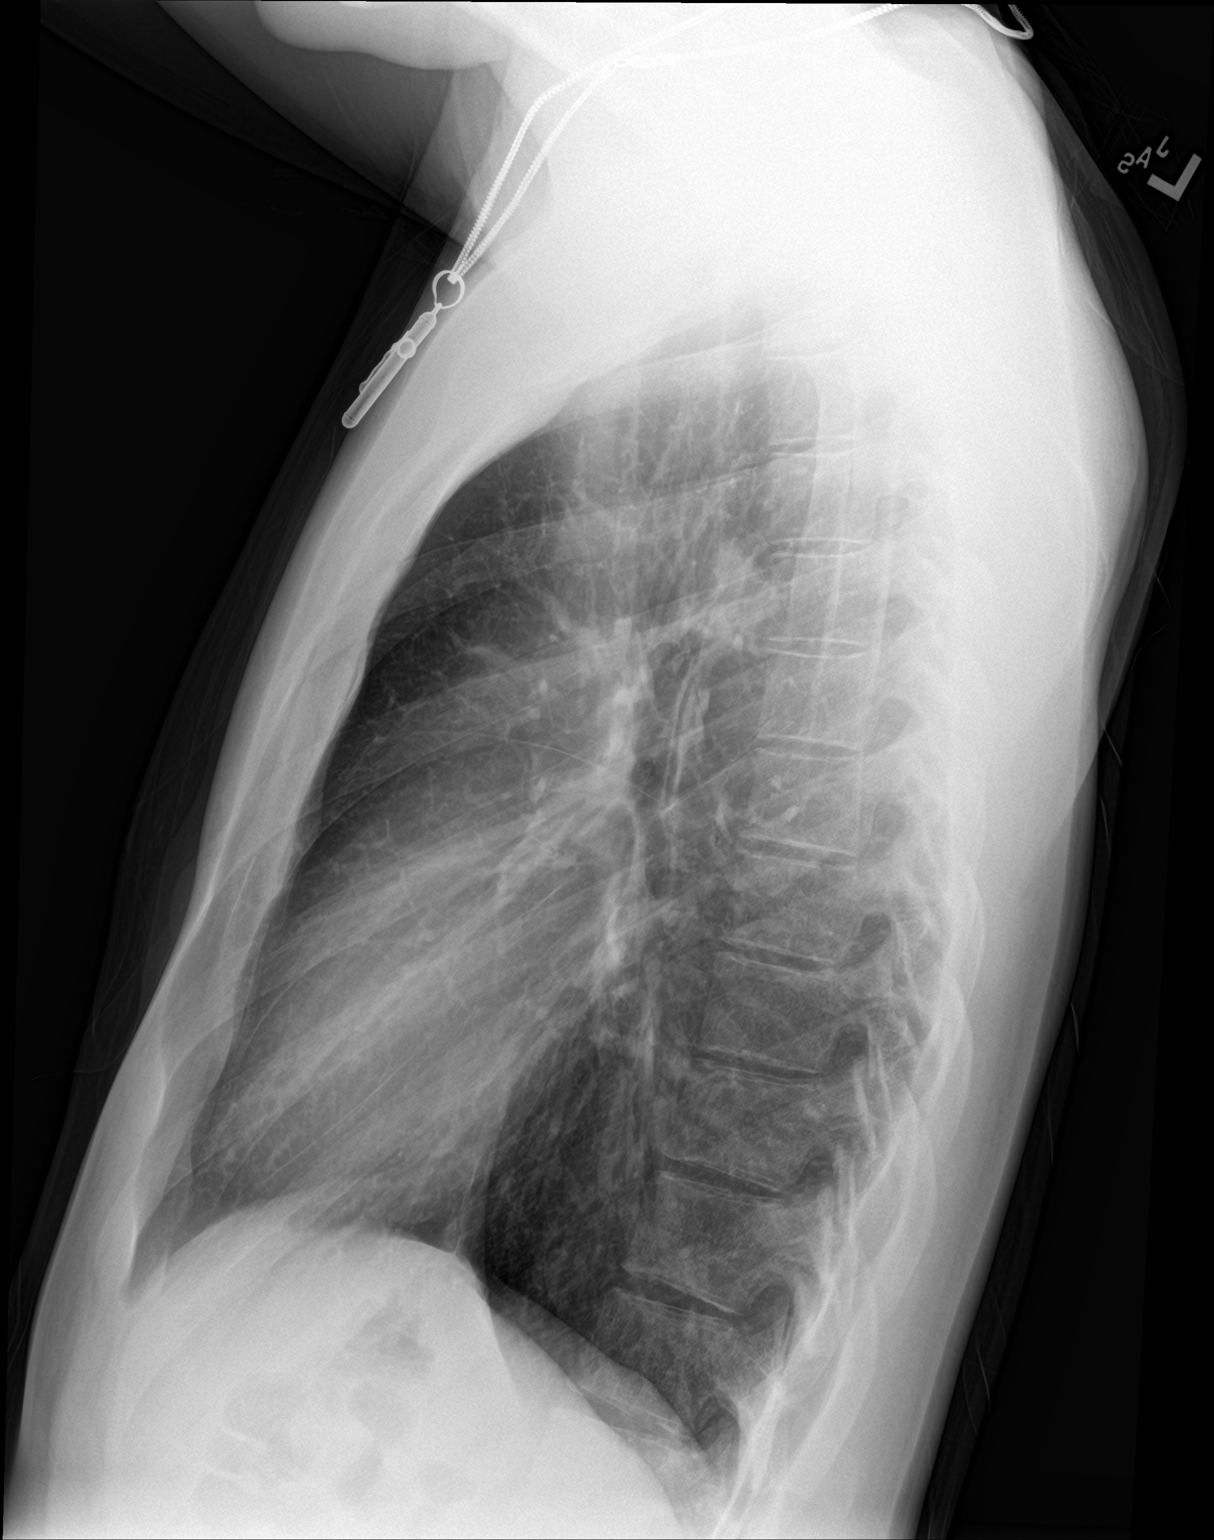

[2 of 2 positions shown; findings below may reference images not displayed]

FINDINGS: Stable cardiomediastinal silhouette with normal heart size. No
pneumothorax. No pleural effusion. Lungs appear clear, with no acute
consolidative airspace disease and no pulmonary edema.
IMPRESSION: No active cardiopulmonary disease.

## 2020-02-16 ENCOUNTER — Ambulatory Visit (HOSPITAL_COMMUNITY)
Admission: RE | Admit: 2020-02-16 | Discharge: 2020-02-16 | Disposition: A | Discharge status: Home / Self Care | Payer: Self-pay | Attending: Psychiatry | Admitting: Psychiatry

## 2020-02-16 DIAGNOSIS — F419 Anxiety disorder, unspecified: Secondary | ICD-10-CM | POA: Insufficient documentation

## 2020-02-16 DIAGNOSIS — F32A Depression, unspecified: Secondary | ICD-10-CM | POA: Insufficient documentation

## 2020-02-16 NOTE — BH Assessment (Cosign Needed)
Behavioral Health Medical Screening Exam  Danny White is an 24 y.o. male. Pt came walk in to [facility] on 02/16/2020. Pt states he is feeling anxious and need resources for therapy. Pt states he broke up with his Girlfriend 3 weeks ago and need some help. Pt reports depressed mood but denies any suicidal ideation or homicidal ideation. Pt reports social anxiety states he feels anxious when talking to strangers. Pt denies visual and auditory hallucinations. Pt denies any history of psychiatric illness and hospitalizations. Pt reports past history of suicidal ideation 2 years ago when he had an argument with his girlfriend but denies any history of suicidal attempt. Pt states he just need resources for therapy for irritability and anger issues. He denies any drug use. Pt was provided with resources to make appointment for outpatient therapy services.  Total Time spent with patient: 15 minutes  Psychiatric Specialty Exam: Physical Exam Constitutional:      General: He is not in acute distress.    Appearance: Normal appearance. He is normal weight. He is not ill-appearing or toxic-appearing.  HENT:     Head: Normocephalic and atraumatic.  Pulmonary:     Effort: Pulmonary effort is normal.  Neurological:     General: No focal deficit present.     Mental Status: He is alert and oriented to person, place, and time.    Review of Systems  Constitutional: Negative.  Negative for activity change, appetite change, fatigue and fever.  HENT: Negative.   Eyes: Negative.   Respiratory: Negative.   Cardiovascular: Negative.   Gastrointestinal: Negative.   Musculoskeletal: Negative.   Neurological: Negative.   Psychiatric/Behavioral: Positive for dysphoric mood. Negative for agitation, behavioral problems, confusion, decreased concentration, hallucinations and suicidal ideas. The patient is nervous/anxious.    There were no vitals taken for this visit.There is no height or weight on file to calculate  BMI. General Appearance: Casual Eye Contact:  Good Speech:  Clear and Coherent Volume:  Normal Mood:  Dysphoric Affect:  Congruent Thought Process:  Coherent and Descriptions of Associations: Intact Orientation:  Full (Time, Place, and Person) Thought Content:  Logical Suicidal Thoughts:  No Homicidal Thoughts:  No Memory:  Immediate;   Good Recent;   Good Remote;   Good Judgement:  Good Insight:  Good Psychomotor Activity:  Normal Concentration: Concentration: Good and Attention Span: Good Recall:  Good Fund of Knowledge:Good Language: Good Akathisia:  Negative Handed:  Right AIMS (if indicated):    Assets:  Communication Skills Desire for Improvement Physical Health Talents/Skills Sleep:     Musculoskeletal: Strength & Muscle Tone: within normal limits Gait & Station: normal Patient leans: N/A  There were no vitals taken for this visit.  Recommendations: Based on my evaluation the patient does not appear to have an emergency medical condition.  Karsten Ro, MD 02/16/2020, 6:09 PM

## 2020-02-16 NOTE — BH Assessment (Signed)
Assessment Note  Danny White is an 24 y.o. male that presents this date requesting information on counseling so he can better communicate with his partner. Patient denies any S/I, H/I or AVH. Patient denies any previous attempts or gestures at self harm. Patient denies any prior mental health diagnosis. Patient denies any history of self harm. Patient denies any history of SA issues. Patient denies any current mental health symptoms. Patient is employed at [employer] and resides alone although has been involved in a one year relationship with his partner and their relationship ended 3 weeks ago due to patient "not talking about their issues." Patient states this is not the first time he has "heard this from a partner" so he feels he may need to get some counseling. Patient also reports a history of social anxiety and feels that also is associated with him not being able to communicate. Patient denies any history of abuse or access to firearms. Patient is oriented x5 and presents with a pleasant affect. Patient's memory is intact and thoughts organized. Patient does not appear to be responding to internal stimuli. Case was staffed with Doda MD who recommended patient be discharged with OP resources. Patient was provided with area providers and agencies who could assist with current needs.        Diagnosis: Adjustment disorder  Past Medical History:  Past Medical History:  Diagnosis Date  . Asthma     No past surgical history on file.  Family History:  Family History  Problem Relation Age of Onset  . Stroke Mother   . Healthy Mother   . Hyperlipidemia Sister   . Heart disease Maternal Grandmother   . Heart disease Maternal Grandfather   . Healthy Paternal Grandmother   . Healthy Paternal Grandfather     Social History:  reports that he has been smoking. He has been smoking about 0.50 packs per day. He has never used smokeless tobacco. He reports current alcohol use. He reports current drug use.  Drug: Marijuana.  Additional Social History:  Alcohol / Drug Use Pain Medications: See MAR Prescriptions: See MAR Over the Counter: See MAR History of alcohol / drug use?: No history of alcohol / drug abuse  CIWA:   COWS:    Allergies: No Known Allergies  Home Medications: (Not in a hospital admission)   OB/GYN Status:  No LMP for male patient.  General Assessment Data Location of Assessment:  [facility]) TTS Assessment: In system Is this a Tele or Face-to-Face Assessment?: Face-to-Face Is this an Initial Assessment or a Re-assessment for this encounter?: Initial Assessment Patient Accompanied by:: N/A Language Other than English: No Living Arrangements: Other (Comment) (Alone) What gender do you identify as?: Male Marital status: Single Living Arrangements: Alone Can pt return to current living arrangement?: Yes Admission Status: Voluntary Is patient capable of signing voluntary admission?: Yes Referral Source: Self/Family/Friend Insurance type: SP     Crisis Care Plan Living Arrangements: Alone Legal Guardian:  (NA) Name of Psychiatrist: None Name of Therapist: None  Education Status Is patient currently in school?: No Is the patient employed, unemployed or receiving disability?: Employed  Risk to self with the past 6 months Suicidal Ideation: No Has patient been a risk to self within the past 6 months prior to admission? : No Suicidal Intent: No Has patient had any suicidal intent within the past 6 months prior to admission? : No Is patient at risk for suicide?: No Suicidal Plan?: No Has patient had any suicidal plan within  the past 6 months prior to admission? : No Access to Means: No What has been your use of drugs/alcohol within the last 12 months?: Denies Previous Attempts/Gestures: No How many times?: 0 Other Self Harm Risks:  (NA) Triggers for Past Attempts:  (NA) Intentional Self Injurious Behavior: None Family Suicide History: No Recent stressful  life event(s): Other (Comment) (Relationship issues) Persecutory voices/beliefs?: No Depression: No Depression Symptoms:  (Denies) Substance abuse history and/or treatment for substance abuse?: No Suicide prevention information given to non-admitted patients: Not applicable  Risk to Others within the past 6 months Homicidal Ideation: No Does patient have any lifetime risk of violence toward others beyond the six months prior to admission? : No Thoughts of Harm to Others: No Current Homicidal Intent: No Current Homicidal Plan: No Access to Homicidal Means: No Identified Victim:  (NA) History of harm to others?: No Assessment of Violence: None Noted Violent Behavior Description:  (NA) Does patient have access to weapons?: No Criminal Charges Pending?: No Does patient have a court date: No Is patient on probation?: No  Psychosis Hallucinations: None noted Delusions: None noted  Mental Status Report Appearance/Hygiene: Unremarkable Eye Contact: Good Motor Activity: Freedom of movement Speech: Logical/coherent Level of Consciousness: Alert Mood: Pleasant Affect: Appropriate to circumstance Anxiety Level: None Thought Processes: Coherent, Relevant Judgement: Unimpaired Orientation: Person, Place, Time Obsessive Compulsive Thoughts/Behaviors: None  Cognitive Functioning Concentration: Normal Memory: Recent Intact, Remote Intact Is patient IDD: No Insight: Good Impulse Control: Good Appetite: Good Have you had any weight changes? : No Change Sleep: No Change Total Hours of Sleep: 8 Vegetative Symptoms: None  ADLScreening [facility] Assessment Services) Patient's cognitive ability adequate to safely complete daily activities?: Yes Patient able to express need for assistance with ADLs?: Yes Independently performs ADLs?: Yes (appropriate for developmental age)  Prior Inpatient Therapy Prior Inpatient Therapy: No  Prior Outpatient Therapy Prior Outpatient Therapy: No Does  patient have an ACCT team?: No Does patient have Intensive In-House Services?  : No Does patient have [facility] services? : No Does patient have P4CC services?: No  ADL Screening (condition at time of admission) Patient's cognitive ability adequate to safely complete daily activities?: Yes Is the patient deaf or have difficulty hearing?: No Does the patient have difficulty seeing, even when wearing glasses/contacts?: No Does the patient have difficulty concentrating, remembering, or making decisions?: No Patient able to express need for assistance with ADLs?: Yes Does the patient have difficulty dressing or bathing?: No Independently performs ADLs?: Yes (appropriate for developmental age) Does the patient have difficulty walking or climbing stairs?: No Weakness of Legs: None Weakness of Arms/Hands: None  Home Assistive Devices/Equipment Home Assistive Devices/Equipment: None  Therapy Consults (therapy consults require a physician order) PT Evaluation Needed: No OT Evalulation Needed: No SLP Evaluation Needed: No Abuse/Neglect Assessment (Assessment to be complete while patient is alone) Abuse/Neglect Assessment Can Be Completed: Yes Physical Abuse: Denies Verbal Abuse: Denies Sexual Abuse: Denies Exploitation of patient/patient's resources: Denies Self-Neglect: Denies Values / Beliefs Cultural Requests During Hospitalization: None Spiritual Requests During Hospitalization: None Consults Spiritual Care Consult Needed: No Transition of Care Team Consult Needed: No Advance Directives (For Healthcare) Does Patient Have a Medical Advance Directive?: No Would patient like information on creating a medical advance directive?: No - Patient declined          Disposition: Case was staffed with Doda MD who recommended patient be discharged with OP resources. Patient was provided with area providers and agencies who could assist with current needs.  Disposition Initial  Assessment Completed for this Encounter: Yes Disposition of Patient: Discharge  On Site Evaluation by:   Reviewed with Physician:    Alfredia Ferguson 02/16/2020 6:14 PM
